# Patient Record
Sex: Female | Born: 1970 | Race: White | Hispanic: No | Marital: Married
Health system: Southern US, Community
[De-identification: ages and names within clinical notes are randomized; demographics above are authoritative.]

## PROBLEM LIST (undated history)

## (undated) HISTORY — PX: AUGMENTATION MAMMAPLASTY: SUR837

---

## 2015-03-25 DIAGNOSIS — E039 Hypothyroidism, unspecified: Secondary | ICD-10-CM | POA: Insufficient documentation

## 2015-03-25 DIAGNOSIS — J3089 Other allergic rhinitis: Secondary | ICD-10-CM | POA: Insufficient documentation

## 2015-03-25 DIAGNOSIS — E038 Other specified hypothyroidism: Secondary | ICD-10-CM | POA: Insufficient documentation

## 2015-05-28 DIAGNOSIS — M7550 Bursitis of unspecified shoulder: Secondary | ICD-10-CM | POA: Insufficient documentation

## 2016-02-27 ENCOUNTER — Other Ambulatory Visit: Payer: Self-pay | Admitting: Physician Assistant

## 2016-02-27 DIAGNOSIS — Z1231 Encounter for screening mammogram for malignant neoplasm of breast: Secondary | ICD-10-CM

## 2016-03-27 ENCOUNTER — Ambulatory Visit
Admission: RE | Admit: 2016-03-27 | Discharge: 2016-03-27 | Disposition: A | Discharge status: Home / Self Care | Payer: PRIVATE HEALTH INSURANCE | Source: Ambulatory Visit | Attending: Physician Assistant | Admitting: Physician Assistant

## 2016-03-27 DIAGNOSIS — Z1231 Encounter for screening mammogram for malignant neoplasm of breast: Secondary | ICD-10-CM

## 2016-12-18 DIAGNOSIS — G8929 Other chronic pain: Secondary | ICD-10-CM | POA: Insufficient documentation

## 2016-12-18 DIAGNOSIS — M546 Pain in thoracic spine: Secondary | ICD-10-CM

## 2016-12-22 ENCOUNTER — Ambulatory Visit
Admission: RE | Admit: 2016-12-22 | Discharge: 2016-12-22 | Disposition: A | Discharge status: Home / Self Care | Payer: PRIVATE HEALTH INSURANCE | Source: Ambulatory Visit | Attending: Physician Assistant | Admitting: Physician Assistant

## 2016-12-22 ENCOUNTER — Other Ambulatory Visit: Payer: Self-pay | Admitting: Physician Assistant

## 2016-12-22 DIAGNOSIS — G8929 Other chronic pain: Secondary | ICD-10-CM

## 2016-12-22 DIAGNOSIS — M546 Pain in thoracic spine: Principal | ICD-10-CM

## 2016-12-22 MED ORDER — TRIAMCINOLONE ACETONIDE 40 MG/ML IJ SUSP (RADIOLOGY)
60.0000 mg | Freq: Once | INTRAMUSCULAR | Status: AC
  Administered 2016-12-22: 60 mg via EPIDURAL

## 2016-12-22 MED ORDER — IOPAMIDOL (ISOVUE-M 300) INJECTION 61%
1.0000 mL | Freq: Once | INTRAMUSCULAR | Status: AC | PRN
  Administered 2016-12-22: 1 mL via EPIDURAL

## 2016-12-22 NOTE — Discharge Instructions (Signed)

## 2017-01-26 ENCOUNTER — Other Ambulatory Visit: Payer: Self-pay | Admitting: Physician Assistant

## 2017-01-26 ENCOUNTER — Ambulatory Visit
Admission: RE | Admit: 2017-01-26 | Discharge: 2017-01-26 | Disposition: A | Discharge status: Home / Self Care | Payer: PRIVATE HEALTH INSURANCE | Source: Ambulatory Visit | Attending: Physician Assistant | Admitting: Physician Assistant

## 2017-01-26 DIAGNOSIS — R059 Cough, unspecified: Secondary | ICD-10-CM

## 2017-01-26 DIAGNOSIS — R05 Cough: Secondary | ICD-10-CM

## 2017-03-02 ENCOUNTER — Other Ambulatory Visit: Payer: Self-pay | Admitting: Physician Assistant

## 2017-03-02 DIAGNOSIS — Z1231 Encounter for screening mammogram for malignant neoplasm of breast: Secondary | ICD-10-CM

## 2017-03-29 ENCOUNTER — Ambulatory Visit
Admission: RE | Admit: 2017-03-29 | Discharge: 2017-03-29 | Disposition: A | Discharge status: Home / Self Care | Payer: PRIVATE HEALTH INSURANCE | Source: Ambulatory Visit | Attending: Physician Assistant | Admitting: Physician Assistant

## 2017-03-29 DIAGNOSIS — Z1231 Encounter for screening mammogram for malignant neoplasm of breast: Secondary | ICD-10-CM

## 2017-07-12 ENCOUNTER — Other Ambulatory Visit: Payer: Self-pay | Admitting: *Deleted

## 2017-07-12 ENCOUNTER — Ambulatory Visit
Admission: RE | Admit: 2017-07-12 | Discharge: 2017-07-12 | Disposition: A | Discharge status: Home / Self Care | Payer: PRIVATE HEALTH INSURANCE | Source: Ambulatory Visit | Attending: *Deleted | Admitting: *Deleted

## 2017-07-12 DIAGNOSIS — R0902 Hypoxemia: Secondary | ICD-10-CM

## 2018-02-17 ENCOUNTER — Other Ambulatory Visit: Payer: Self-pay | Admitting: Sports Medicine

## 2018-02-17 DIAGNOSIS — M659 Synovitis and tenosynovitis, unspecified: Secondary | ICD-10-CM

## 2018-02-25 ENCOUNTER — Ambulatory Visit
Admission: RE | Admit: 2018-02-25 | Discharge: 2018-02-25 | Disposition: A | Discharge status: Home / Self Care | Payer: PRIVATE HEALTH INSURANCE | Source: Ambulatory Visit | Attending: Sports Medicine | Admitting: Sports Medicine

## 2018-02-25 DIAGNOSIS — M659 Synovitis and tenosynovitis, unspecified: Secondary | ICD-10-CM

## 2018-02-25 DIAGNOSIS — M65959 Unspecified synovitis and tenosynovitis, unspecified thigh: Secondary | ICD-10-CM

## 2018-02-25 MED ORDER — IOPAMIDOL (ISOVUE-M 200) INJECTION 41%
15.0000 mL | Freq: Once | INTRAMUSCULAR | Status: AC
  Administered 2018-02-25: 15 mL via INTRA_ARTICULAR

## 2018-04-28 ENCOUNTER — Other Ambulatory Visit: Payer: Self-pay | Admitting: Physician Assistant

## 2018-04-28 DIAGNOSIS — Z1231 Encounter for screening mammogram for malignant neoplasm of breast: Secondary | ICD-10-CM

## 2018-05-02 ENCOUNTER — Ambulatory Visit
Admission: RE | Admit: 2018-05-02 | Discharge: 2018-05-02 | Disposition: A | Discharge status: Home / Self Care | Payer: PRIVATE HEALTH INSURANCE | Source: Ambulatory Visit

## 2018-05-02 DIAGNOSIS — Z1231 Encounter for screening mammogram for malignant neoplasm of breast: Secondary | ICD-10-CM

## 2019-06-16 ENCOUNTER — Other Ambulatory Visit: Payer: Self-pay | Admitting: Physician Assistant

## 2019-06-16 DIAGNOSIS — Z1231 Encounter for screening mammogram for malignant neoplasm of breast: Secondary | ICD-10-CM

## 2019-06-21 ENCOUNTER — Other Ambulatory Visit: Payer: Self-pay

## 2019-06-21 ENCOUNTER — Ambulatory Visit
Admission: RE | Admit: 2019-06-21 | Discharge: 2019-06-21 | Disposition: A | Discharge status: Home / Self Care | Payer: BC Managed Care – PPO | Source: Ambulatory Visit

## 2019-06-21 DIAGNOSIS — Z1231 Encounter for screening mammogram for malignant neoplasm of breast: Secondary | ICD-10-CM

## 2019-09-04 IMAGING — MR MR HIP*L* W/CM
5 series · 40 of 40 positions shown · IV contrast (agent unspecified)
Comparison: None.

CLINICAL DATA: Left hip pain with internal and external rotation
for 2 months.

EXAM:
MRI OF THE LEFT HIP WITH CONTRAST (MR Arthrogram)
TECHNIQUE: Multiplanar, multisequence MR imaging of the hip was performed
immediately following contrast injection into the hip joint under
fluoroscopic guidance. No intravenous contrast was administered.

[Series 3: T2 fat-sat · coronal · 4.0mm · 1.12mm/px · 7 of 18 slices shown]
[im 1/18]
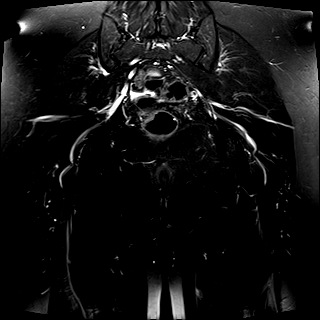
[im 3/18]
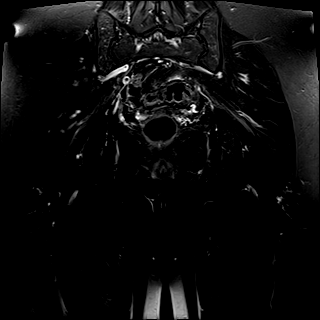
[im 6/18]
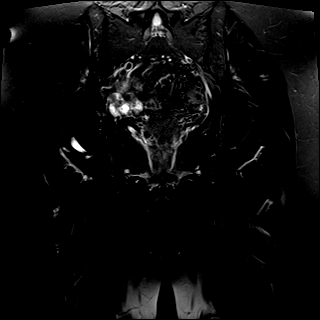
[im 9/18]
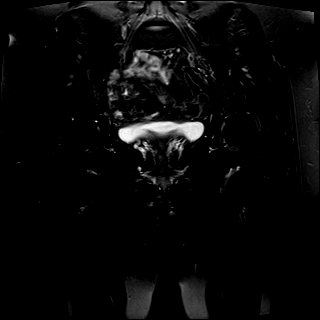
[im 12/18]
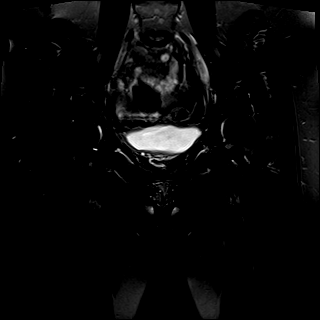
[im 15/18]
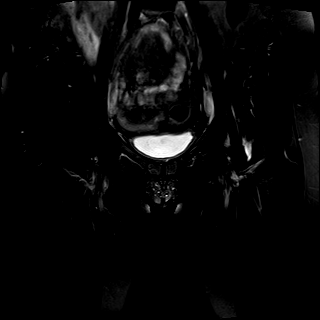
[im 18/18]
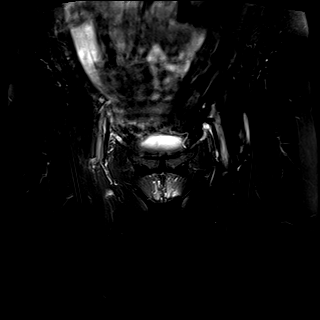

[Series 4: T1 · coronal · 4.0mm · 1.12mm/px · 8 of 18 slices shown]
[im 1/18]
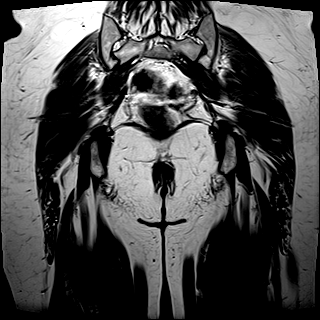
[im 3/18]
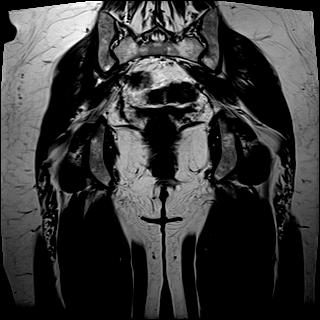
[im 5/18]
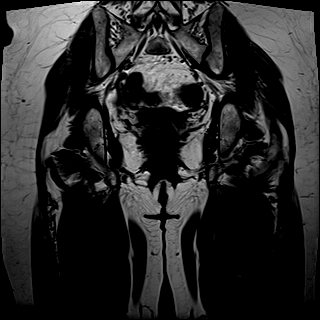
[im 8/18]
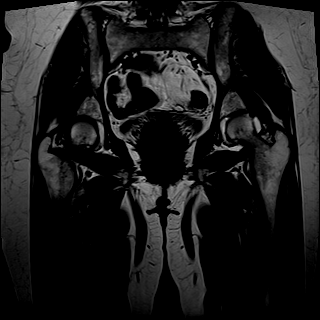
[im 10/18]
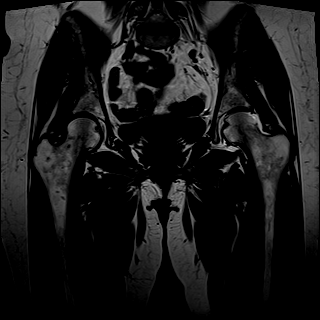
[im 13/18]
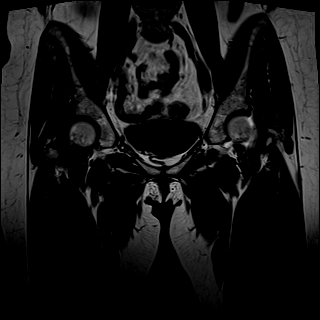
[im 15/18]
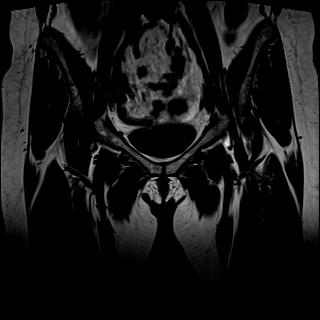
[im 18/18]
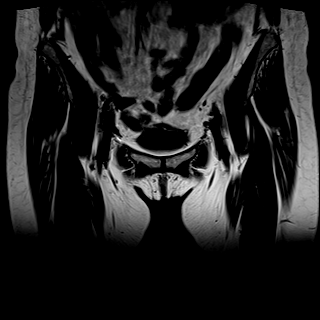

[Series 5: T1 fat-sat · coronal · 4.0mm · 0.56mm/px · 8 of 18 slices shown (1 of 3)]
[im 1/18]
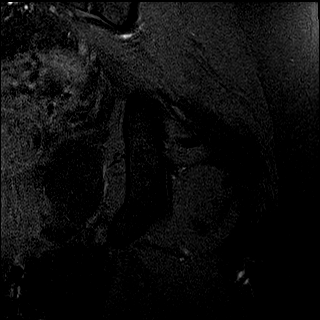
[im 3/18]
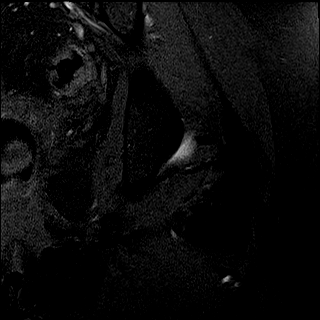
[im 5/18]
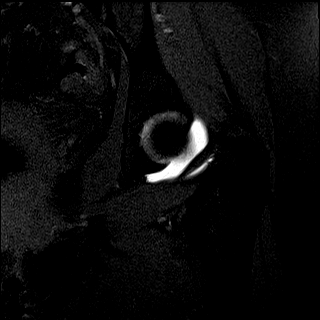
[im 8/18]
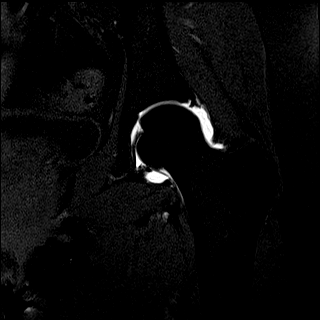
[im 10/18]
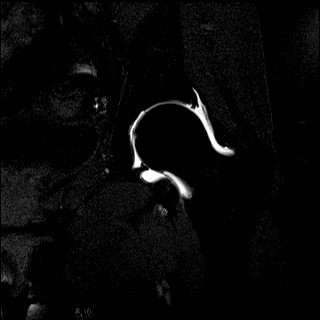
[im 13/18]
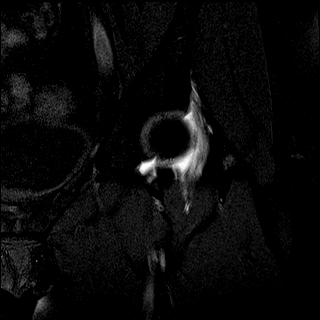
[im 15/18]
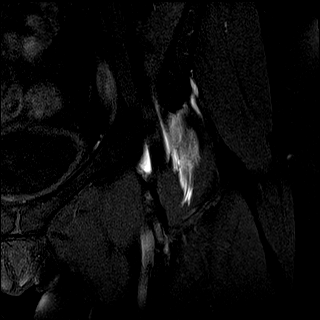
[im 18/18]
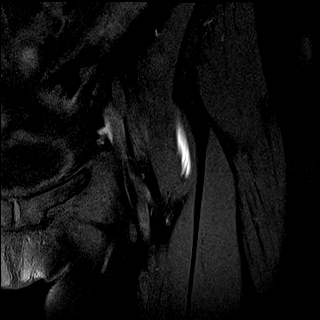

[Series 6: T1 fat-sat · oblique · 4.0mm · 0.70mm/px · 8 of 18 slices shown (2 of 3)]
[im 1/18]
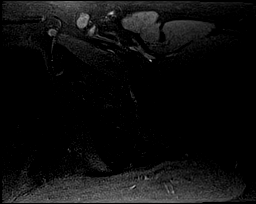
[im 3/18]
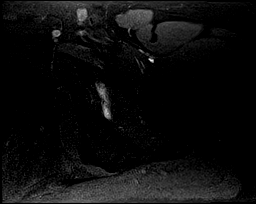
[im 5/18]
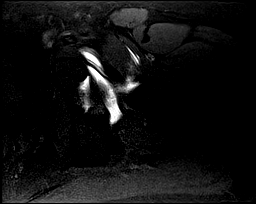
[im 8/18]
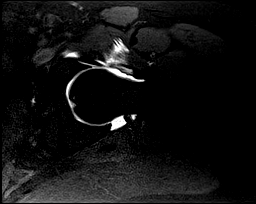
[im 10/18]
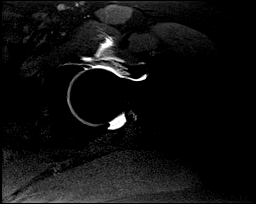
[im 13/18]
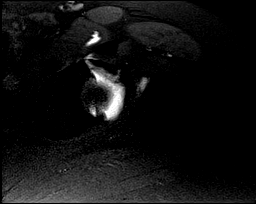
[im 15/18]
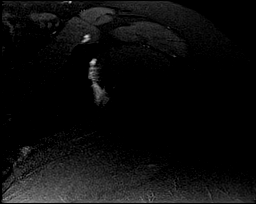
[im 18/18]
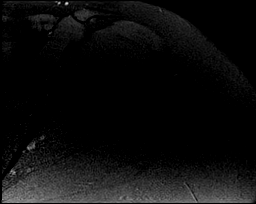

[Series 7: T1 fat-sat · sagittal · 4.0mm · 0.70mm/px · 9 of 22 slices shown (3 of 3)]
[im 1/22]
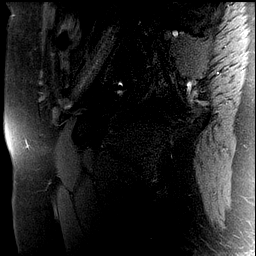
[im 3/22]
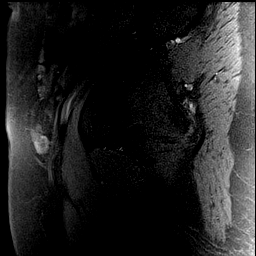
[im 6/22]
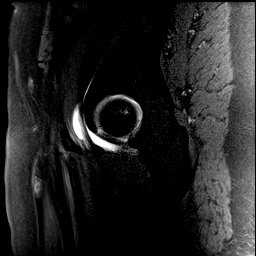
[im 8/22]
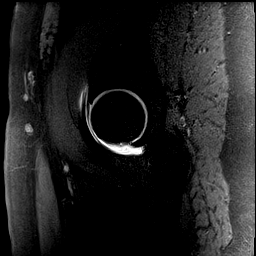
[im 11/22]
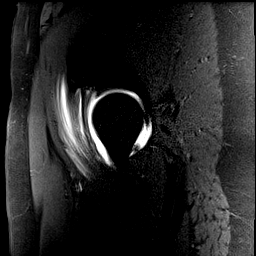
[im 14/22]
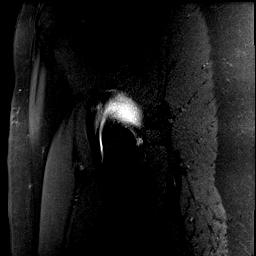
[im 16/22]
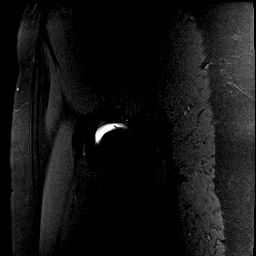
[im 19/22]
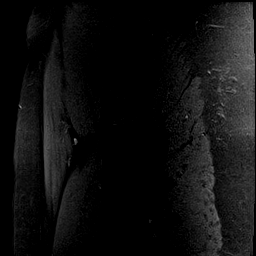
[im 22/22]
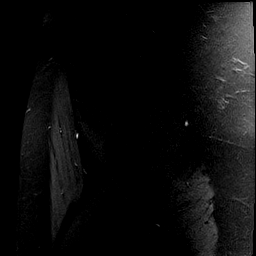

[40 of 40 positions shown; findings below may reference images not displayed]

FINDINGS: Bone

No acute fracture or dislocation. No aggressive osseous lesion. No
bone marrow edema. No avascular necrosis.

Normal sacroiliac joints. No erosive changes or widening of the SI
joints.

Alignment

Normal. No subluxation.

Dysplasia

None.

Joint effusion

Intraarticular contrast distending the left hip joint capsule. No
right hip joint effusion. No SI joint effusion.

Labrum

Small superior anterior left labral tear.

Cartilage

Femoral cartilage: Normal.

Acetabular cartilage: Normal.

Capsule and ligaments

Normal.

Muscles and Tendons

Flexors: Normal.

Extensors: Normal.

Abductors: Normal.

Adductors: Normal.

Rotators: Normal.

Hamstrings: Normal.

Other Findings

None

Viscera

Normal. No abnormality seen in pelvis. No lymphadenopathy. No free
fluid in the pelvis.
IMPRESSION: 1. Small superior anterior left labral tear.

## 2020-06-05 ENCOUNTER — Other Ambulatory Visit: Payer: Self-pay | Admitting: Physician Assistant

## 2020-06-05 DIAGNOSIS — Z1231 Encounter for screening mammogram for malignant neoplasm of breast: Secondary | ICD-10-CM

## 2020-06-13 DIAGNOSIS — Z1231 Encounter for screening mammogram for malignant neoplasm of breast: Secondary | ICD-10-CM

## 2020-07-01 DIAGNOSIS — Z1231 Encounter for screening mammogram for malignant neoplasm of breast: Secondary | ICD-10-CM

## 2020-08-12 ENCOUNTER — Other Ambulatory Visit: Payer: Self-pay

## 2020-08-12 ENCOUNTER — Ambulatory Visit
Admission: RE | Admit: 2020-08-12 | Discharge: 2020-08-12 | Disposition: A | Discharge status: Home / Self Care | Payer: BC Managed Care – PPO | Source: Ambulatory Visit | Attending: Physician Assistant | Admitting: Physician Assistant

## 2020-08-12 ENCOUNTER — Other Ambulatory Visit: Payer: Self-pay | Admitting: Physician Assistant

## 2020-08-12 DIAGNOSIS — Z1231 Encounter for screening mammogram for malignant neoplasm of breast: Secondary | ICD-10-CM

## 2021-07-01 ENCOUNTER — Other Ambulatory Visit: Payer: Self-pay | Admitting: Physician Assistant

## 2021-07-01 DIAGNOSIS — Z1231 Encounter for screening mammogram for malignant neoplasm of breast: Secondary | ICD-10-CM

## 2021-08-06 ENCOUNTER — Other Ambulatory Visit: Payer: Self-pay | Admitting: Pediatrics

## 2021-08-06 DIAGNOSIS — H81399 Other peripheral vertigo, unspecified ear: Secondary | ICD-10-CM

## 2021-08-25 DIAGNOSIS — Z1231 Encounter for screening mammogram for malignant neoplasm of breast: Secondary | ICD-10-CM

## 2021-08-26 ENCOUNTER — Ambulatory Visit
Admission: RE | Admit: 2021-08-26 | Discharge: 2021-08-26 | Disposition: A | Discharge status: Home / Self Care | Payer: BC Managed Care – PPO | Source: Ambulatory Visit | Attending: Pediatrics | Admitting: Pediatrics

## 2021-08-26 DIAGNOSIS — H81399 Other peripheral vertigo, unspecified ear: Secondary | ICD-10-CM

## 2021-08-26 MED ORDER — GADOBENATE DIMEGLUMINE 529 MG/ML IV SOLN
13.0000 mL | Freq: Once | INTRAVENOUS | Status: AC | PRN
  Administered 2021-08-26: 13 mL via INTRAVENOUS

## 2021-09-23 ENCOUNTER — Ambulatory Visit
Admission: RE | Admit: 2021-09-23 | Discharge: 2021-09-23 | Disposition: A | Discharge status: Home / Self Care | Payer: BC Managed Care – PPO | Source: Ambulatory Visit | Attending: Physician Assistant | Admitting: Physician Assistant

## 2021-09-23 DIAGNOSIS — Z1231 Encounter for screening mammogram for malignant neoplasm of breast: Secondary | ICD-10-CM

## 2022-08-05 ENCOUNTER — Other Ambulatory Visit: Payer: Self-pay | Admitting: Physician Assistant

## 2022-08-05 DIAGNOSIS — Z1231 Encounter for screening mammogram for malignant neoplasm of breast: Secondary | ICD-10-CM

## 2022-08-17 ENCOUNTER — Other Ambulatory Visit: Payer: Self-pay | Admitting: Rehabilitation

## 2022-08-17 DIAGNOSIS — M47812 Spondylosis without myelopathy or radiculopathy, cervical region: Secondary | ICD-10-CM

## 2022-08-25 ENCOUNTER — Ambulatory Visit
Admission: RE | Admit: 2022-08-25 | Discharge: 2022-08-25 | Disposition: A | Discharge status: Home / Self Care | Payer: BC Managed Care – PPO | Source: Ambulatory Visit | Attending: Rehabilitation | Admitting: Rehabilitation

## 2022-08-25 DIAGNOSIS — M47812 Spondylosis without myelopathy or radiculopathy, cervical region: Secondary | ICD-10-CM

## 2022-08-29 LAB — EXTERNAL GENERIC LAB PROCEDURE: COLOGUARD: NEGATIVE

## 2022-08-29 LAB — COLOGUARD: COLOGUARD: NEGATIVE

## 2022-09-25 ENCOUNTER — Ambulatory Visit
Admission: RE | Admit: 2022-09-25 | Discharge: 2022-09-25 | Disposition: A | Discharge status: Home / Self Care | Payer: BC Managed Care – PPO | Source: Ambulatory Visit

## 2022-09-25 DIAGNOSIS — Z1231 Encounter for screening mammogram for malignant neoplasm of breast: Secondary | ICD-10-CM

## 2023-04-01 ENCOUNTER — Other Ambulatory Visit: Payer: Self-pay | Admitting: Orthopaedic Surgery

## 2023-04-01 DIAGNOSIS — M5416 Radiculopathy, lumbar region: Secondary | ICD-10-CM

## 2023-04-01 DIAGNOSIS — M51362 Other intervertebral disc degeneration, lumbar region with discogenic back pain and lower extremity pain: Secondary | ICD-10-CM

## 2023-04-06 ENCOUNTER — Ambulatory Visit
Admission: RE | Admit: 2023-04-06 | Discharge: 2023-04-06 | Disposition: A | Discharge status: Home / Self Care | Payer: BC Managed Care – PPO | Source: Ambulatory Visit | Attending: Orthopaedic Surgery | Admitting: Orthopaedic Surgery

## 2023-04-06 DIAGNOSIS — M51362 Other intervertebral disc degeneration, lumbar region with discogenic back pain and lower extremity pain: Secondary | ICD-10-CM

## 2023-04-06 DIAGNOSIS — M5416 Radiculopathy, lumbar region: Secondary | ICD-10-CM

## 2023-10-28 ENCOUNTER — Ambulatory Visit
Admission: RE | Admit: 2023-10-28 | Discharge: 2023-10-28 | Discharge status: Home / Self Care | Source: Ambulatory Visit | Attending: Physician Assistant

## 2023-10-28 ENCOUNTER — Other Ambulatory Visit: Payer: Self-pay | Admitting: Physician Assistant

## 2023-10-28 DIAGNOSIS — Z1231 Encounter for screening mammogram for malignant neoplasm of breast: Secondary | ICD-10-CM

## 2023-11-09 ENCOUNTER — Ambulatory Visit
